# Patient Record
Sex: Male | Born: 1985 | Race: Black or African American | Hispanic: No | Marital: Single | Smoking: Current every day smoker
Health system: Southern US, Community
[De-identification: ages and names within clinical notes are randomized; demographics above are authoritative.]

## PROBLEM LIST (undated history)

## (undated) DIAGNOSIS — R569 Unspecified convulsions: Secondary | ICD-10-CM

---

## 2017-01-24 ENCOUNTER — Emergency Department (HOSPITAL_COMMUNITY)
Admission: EM | Admit: 2017-01-24 | Discharge: 2017-01-24 | Disposition: A | Discharge status: Home / Self Care | Payer: Medicare Other | Attending: Emergency Medicine | Admitting: Emergency Medicine

## 2017-01-24 ENCOUNTER — Other Ambulatory Visit: Payer: Self-pay

## 2017-01-24 ENCOUNTER — Encounter (HOSPITAL_COMMUNITY): Payer: Self-pay

## 2017-01-24 ENCOUNTER — Emergency Department (HOSPITAL_COMMUNITY): Payer: Medicare Other

## 2017-01-24 DIAGNOSIS — R197 Diarrhea, unspecified: Secondary | ICD-10-CM | POA: Insufficient documentation

## 2017-01-24 DIAGNOSIS — B349 Viral infection, unspecified: Secondary | ICD-10-CM | POA: Diagnosis not present

## 2017-01-24 DIAGNOSIS — F17210 Nicotine dependence, cigarettes, uncomplicated: Secondary | ICD-10-CM | POA: Diagnosis not present

## 2017-01-24 DIAGNOSIS — R0981 Nasal congestion: Secondary | ICD-10-CM | POA: Diagnosis not present

## 2017-01-24 DIAGNOSIS — R070 Pain in throat: Secondary | ICD-10-CM | POA: Insufficient documentation

## 2017-01-24 DIAGNOSIS — R112 Nausea with vomiting, unspecified: Secondary | ICD-10-CM | POA: Diagnosis present

## 2017-01-24 DIAGNOSIS — R05 Cough: Secondary | ICD-10-CM | POA: Insufficient documentation

## 2017-01-24 HISTORY — DX: Unspecified convulsions: R56.9

## 2017-01-24 LAB — COMPREHENSIVE METABOLIC PANEL
ALT: 33 U/L (ref 17–63)
AST: 31 U/L (ref 15–41)
Albumin: 4.3 g/dL (ref 3.5–5.0)
Alkaline Phosphatase: 70 U/L (ref 38–126)
Anion gap: 8 (ref 5–15)
BILIRUBIN TOTAL: 0.8 mg/dL (ref 0.3–1.2)
BUN: 10 mg/dL (ref 6–20)
CHLORIDE: 99 mmol/L — AB (ref 101–111)
CO2: 28 mmol/L (ref 22–32)
Calcium: 9.5 mg/dL (ref 8.9–10.3)
Creatinine, Ser: 1.14 mg/dL (ref 0.61–1.24)
Glucose, Bld: 90 mg/dL (ref 65–99)
Potassium: 3.8 mmol/L (ref 3.5–5.1)
SODIUM: 135 mmol/L (ref 135–145)
TOTAL PROTEIN: 8.1 g/dL (ref 6.5–8.1)

## 2017-01-24 LAB — URINALYSIS, ROUTINE W REFLEX MICROSCOPIC
BILIRUBIN URINE: NEGATIVE
GLUCOSE, UA: NEGATIVE mg/dL
HGB URINE DIPSTICK: NEGATIVE
Ketones, ur: NEGATIVE mg/dL
NITRITE: NEGATIVE
PH: 5 (ref 5.0–8.0)
Protein, ur: 30 mg/dL — AB
SPECIFIC GRAVITY, URINE: 1.035 — AB (ref 1.005–1.030)

## 2017-01-24 LAB — CBC
HCT: 45.5 % (ref 39.0–52.0)
Hemoglobin: 15.1 g/dL (ref 13.0–17.0)
MCH: 28.5 pg (ref 26.0–34.0)
MCHC: 33.2 g/dL (ref 30.0–36.0)
MCV: 85.8 fL (ref 78.0–100.0)
PLATELETS: 195 10*3/uL (ref 150–400)
RBC: 5.3 MIL/uL (ref 4.22–5.81)
RDW: 14.1 % (ref 11.5–15.5)
WBC: 8.5 10*3/uL (ref 4.0–10.5)

## 2017-01-24 LAB — LIPASE, BLOOD: Lipase: 31 U/L (ref 11–51)

## 2017-01-24 LAB — RAPID STREP SCREEN (MED CTR MEBANE ONLY): Streptococcus, Group A Screen (Direct): NEGATIVE

## 2017-01-24 MED ORDER — ONDANSETRON HCL 4 MG PO TABS: 4.0000 mg | ORAL_TABLET | Freq: Three times a day (TID) | ORAL | 0 refills | Status: AC | PRN

## 2017-01-24 MED ORDER — ONDANSETRON 4 MG PO TBDP
4.0000 mg | ORAL_TABLET | Freq: Once | ORAL | Status: AC
  Administered 2017-01-24: 4 mg via ORAL
  Filled 2017-01-24: qty 1

## 2017-01-24 MED ORDER — BENZONATATE 100 MG PO CAPS: 100.0000 mg | ORAL_CAPSULE | Freq: Three times a day (TID) | ORAL | 0 refills | Status: AC

## 2017-01-24 MED ORDER — FLUTICASONE PROPIONATE 50 MCG/ACT NA SUSP: 1.0000 | Freq: Every day | NASAL | 0 refills | Status: AC

## 2017-01-24 NOTE — ED Provider Notes (Signed)
[facility]PART[facility] Provider Note   CSN: [id number] Arrival date & time: 01/24/17  1414     History   Chief Complaint Chief Complaint  Patient presents with  . Emesis    HPI Noah DownsMaurice Gosnell is a 31 y.o. male presenting with 1 week history of nausea, vomiting, diarrhea, sore throat, cough, and nasal congestion.  Patient states he has been having symptoms for the past week.  He has tried 1 dose of NyQuil without improvement.  He states that every time he tries to eat or drink anything, he has postprandial vomiting.  His stool is nonbloody.  He reports some intermittent generalized abdominal cramping after vomiting and BMs.  He is unsure if he has had any fevers, but states that he feels he is occasionally more sweaty than normal.  Prior to a week ago, he was symptom-free.  He denies sick contacts.  States that he got his flu shot a month ago at his primary care doctor.  He denies eye pain, ear pain, chest pain, shortness of breath, or urinary symptoms.  Patient has a history of seizures for which he takes Keppra, no other medical history.  No other medicines daily.   HPI  Past Medical History:  Diagnosis Date  . Seizures (HCC)     There are no active problems to display for this patient.   History reviewed. No pertinent surgical history.     Home Medications    Prior to Admission medications   Medication Sig Start Date End Date Taking? Authorizing Provider  benzonatate (TESSALON) 100 MG capsule Take 1 capsule (100 mg total) every 8 (eight) hours by mouth. 01/24/17   Makesha Belitz, PA-C  fluticasone (FLONASE) 50 MCG/ACT nasal spray Place 1 spray daily into both nostrils. 01/24/17   Mauri Tolen, PA-C  ondansetron (ZOFRAN) 4 MG tablet Take 1 tablet (4 mg total) every 8 (eight) hours as needed by mouth for nausea or vomiting. 01/24/17   Irelynn Schermerhorn, PA-C    Family History No family history on file.  Social History Social History   Tobacco Use    . Smoking status: Current Every Day Smoker    Packs/day: 2.00    Types: Cigarettes  . Smokeless tobacco: Never Used  Substance Use Topics  . Alcohol use: Yes    Comment: occasional  . Drug use: No     Allergies   Patient has no known allergies.   Review of Systems Review of Systems  Constitutional: Positive for appetite change and fever (Subjective). Negative for chills.  HENT: Positive for congestion and sore throat. Negative for trouble swallowing and voice change.   Eyes: Negative for pain.  Respiratory: Positive for cough. Negative for chest tightness and shortness of breath.   Cardiovascular: Negative for chest pain, palpitations and leg swelling.  Gastrointestinal: Positive for diarrhea, nausea and vomiting. Negative for blood in stool.  Genitourinary: Negative for dysuria, frequency and hematuria.  Musculoskeletal: Negative for back pain.  Skin: Negative for rash.  Allergic/Immunologic: Negative for immunocompromised state.  Neurological: Negative for headaches.  Hematological: Does not bruise/bleed easily.     Physical Exam Updated Vital Signs BP 128/83 (BP Location: Right Arm)   Pulse 92   Temp 99.6 F (37.6 C) (Oral)   Resp 16   Ht 5\' 11"  (1.803 m)   Wt 108 kg (238 lb)   SpO2 99%   BMI 33.19 kg/m   Physical Exam  Constitutional: He is oriented to person, place, and time. He appears well-developed  and well-nourished. No distress.  HENT:  Head: Normocephalic and atraumatic.  Right Ear: Tympanic membrane, external ear and ear canal normal.  Left Ear: Tympanic membrane, external ear and ear canal normal.  Nose: Mucosal edema present. Right sinus exhibits frontal sinus tenderness. Right sinus exhibits no maxillary sinus tenderness. Left sinus exhibits frontal sinus tenderness. Left sinus exhibits no maxillary sinus tenderness.  Mouth/Throat: Uvula is midline, oropharynx is clear and moist and mucous membranes are normal. No tonsillar exudate.  Nasal mucosal  edema.  Tenderness to palpation of frontal sinuses.  No tonsillar swelling or exudate.  Eyes: Conjunctivae and EOM are normal. Pupils are equal, round, and reactive to light.  Neck: Normal range of motion.  Cardiovascular: Normal rate, regular rhythm and intact distal pulses.  Pulmonary/Chest: Effort normal and breath sounds normal. He has no decreased breath sounds. He has no wheezes. He has no rhonchi. He has no rales.  Pt speaking in full sentences without difficulty.  Clear lung sounds in all fields.  Abdominal: Soft. Bowel sounds are normal. He exhibits no distension and no mass. There is no rebound and no guarding.  Normal active bowel sounds.  No rigidity or guarding.  Mild tenderness to palpation of generalized abdomen which is improved with distraction.  Musculoskeletal: Normal range of motion.  Lymphadenopathy:    He has no cervical adenopathy.  Neurological: He is alert and oriented to person, place, and time.  Skin: Skin is warm.  Psychiatric: He has a normal mood and affect.  Nursing note and vitals reviewed.    ED Treatments / Results  Labs (all labs ordered are listed, but only abnormal results are displayed) Labs Reviewed  COMPREHENSIVE METABOLIC PANEL - Abnormal; Notable for the following components:      Result Value   Chloride 99 (*)    All other components within normal limits  URINALYSIS, ROUTINE W REFLEX MICROSCOPIC - Abnormal; Notable for the following components:   APPearance HAZY (*)    Specific Gravity, Urine 1.035 (*)    Protein, ur 30 (*)    Leukocytes, UA TRACE (*)    Bacteria, UA RARE (*)    Squamous Epithelial / LPF 0-5 (*)    All other components within normal limits  RAPID STREP SCREEN (NOT AT [facility])  CULTURE, GROUP A STREP ([facility])  LIPASE, BLOOD  CBC    EKG  EKG Interpretation None       Radiology Dg Chest 2 View  Result Date: 01/24/2017 CLINICAL DATA:  31 year old male with cough, fever, and sore throat. EXAM: CHEST  2 VIEW  COMPARISON:  None. FINDINGS: The heart size and mediastinal contours are within normal limits. Both lungs are clear. The visualized skeletal structures are unremarkable. IMPRESSION: No active cardiopulmonary disease. Electronically Signed   By: Elgie CollardArash  Radparvar M.D.   On: 01/24/2017 18:26    Procedures Procedures (including critical care time)  Medications Ordered in ED Medications  ondansetron (ZOFRAN-ODT) disintegrating tablet 4 mg (4 mg Oral Given 01/24/17 1741)     Initial Impression / Assessment and Plan / ED Course  I have reviewed the triage vital signs and the nursing notes.  Pertinent labs & imaging results that were available during my care of the patient were reviewed by me and considered in my medical decision making (see chart for details).     Patient presenting for 1 week history of viral symptoms.  Physical exam shows patient is afebrile not tachycardic.  Patient with nasal mucosal edema and frontal pressure.  Abdominal  exam reassuring, as patient with mild tenderness, which improves with distraction.  Labs reassuring, patient without electively abnormalities or leukocytosis, normal lipase, and no change in kidney function.  Likely viral illness.  Will obtain rapid strep and chest x-ray to ensure no bacterial infection.  UA negative for UTI.  Zofran given for nausea.  X-ray and rapid strep negative.  Patient reports improved nausea with Zofran.  Patient tolerated p.o. without difficulty.  Discussed findings with patient.  Discussed symptomatic treatment of viral illnesses.  Patient to follow-up with primary care if symptoms are not improving.  At this time, patient appears safe for discharge.  Return precautions given.  Patient states he understands and agrees to plan.   Final Clinical Impressions(s) / ED Diagnoses   Final diagnoses:  Viral illness    ED Discharge Orders        Ordered    ondansetron (ZOFRAN) 4 MG tablet  Every 8 hours PRN     01/24/17 1902     fluticasone (FLONASE) 50 MCG/ACT nasal spray  Daily     01/24/17 1902    benzonatate (TESSALON) 100 MG capsule  Every 8 hours     01/24/17 1902       Alveria Apley, PA-C 01/25/17 0159    Samuel Jester, DO 01/26/17 1013

## 2017-01-24 NOTE — ED Triage Notes (Signed)
Patient reports of vomiting x1 week. Recently had flu shot last night. Also reports of cough.

## 2017-01-24 NOTE — Discharge Instructions (Signed)
You likely have a viral illness.  This should be treated symptomatically. Use Zofran as needed for nausea or vomiting.  Make sure to eat a bland diet over the next several days.  Avoid spicy, acidic, or fatty foods.  Avoid sugary drinks, as this will cause worsening diarrhea. Use Flonase for nasal congestion and cough. Use Tessalon Perles for cough and sore throat.  Use Tylenol or ibuprofen as needed for fever or body aches. It is very important that she stay well-hydrated with water. Make sure to wash your hands frequently to prevent spread of infection. Follow-up with your primary care doctor by Thursday or Friday for symptoms are not improving. Return to the emergency room if you develop persistent vomiting despite medication, difficulty breathing, or any new or worsening symptoms.

## 2017-01-24 NOTE — ED Notes (Signed)
Pt given gingerale for fluid challenge. 

## 2017-01-24 NOTE — ED Notes (Signed)
Pt alert & oriented x4, stable gait. Patient given discharge instructions, paperwork & prescription(s). Patient  instructed to stop at the registration desk to finish any additional paperwork. Patient verbalized understanding. Pt left department w/ no further questions. 

## 2017-01-27 LAB — CULTURE, GROUP A STREP (THRC)

## 2018-05-02 IMAGING — DX DG CHEST 2V
2 series · 2 of 2 positions shown · non-contrast
Comparison: None.

CLINICAL DATA: 31-year-old male with cough, fever, and sore throat.

EXAM:
CHEST  2 VIEW

[chest pa]
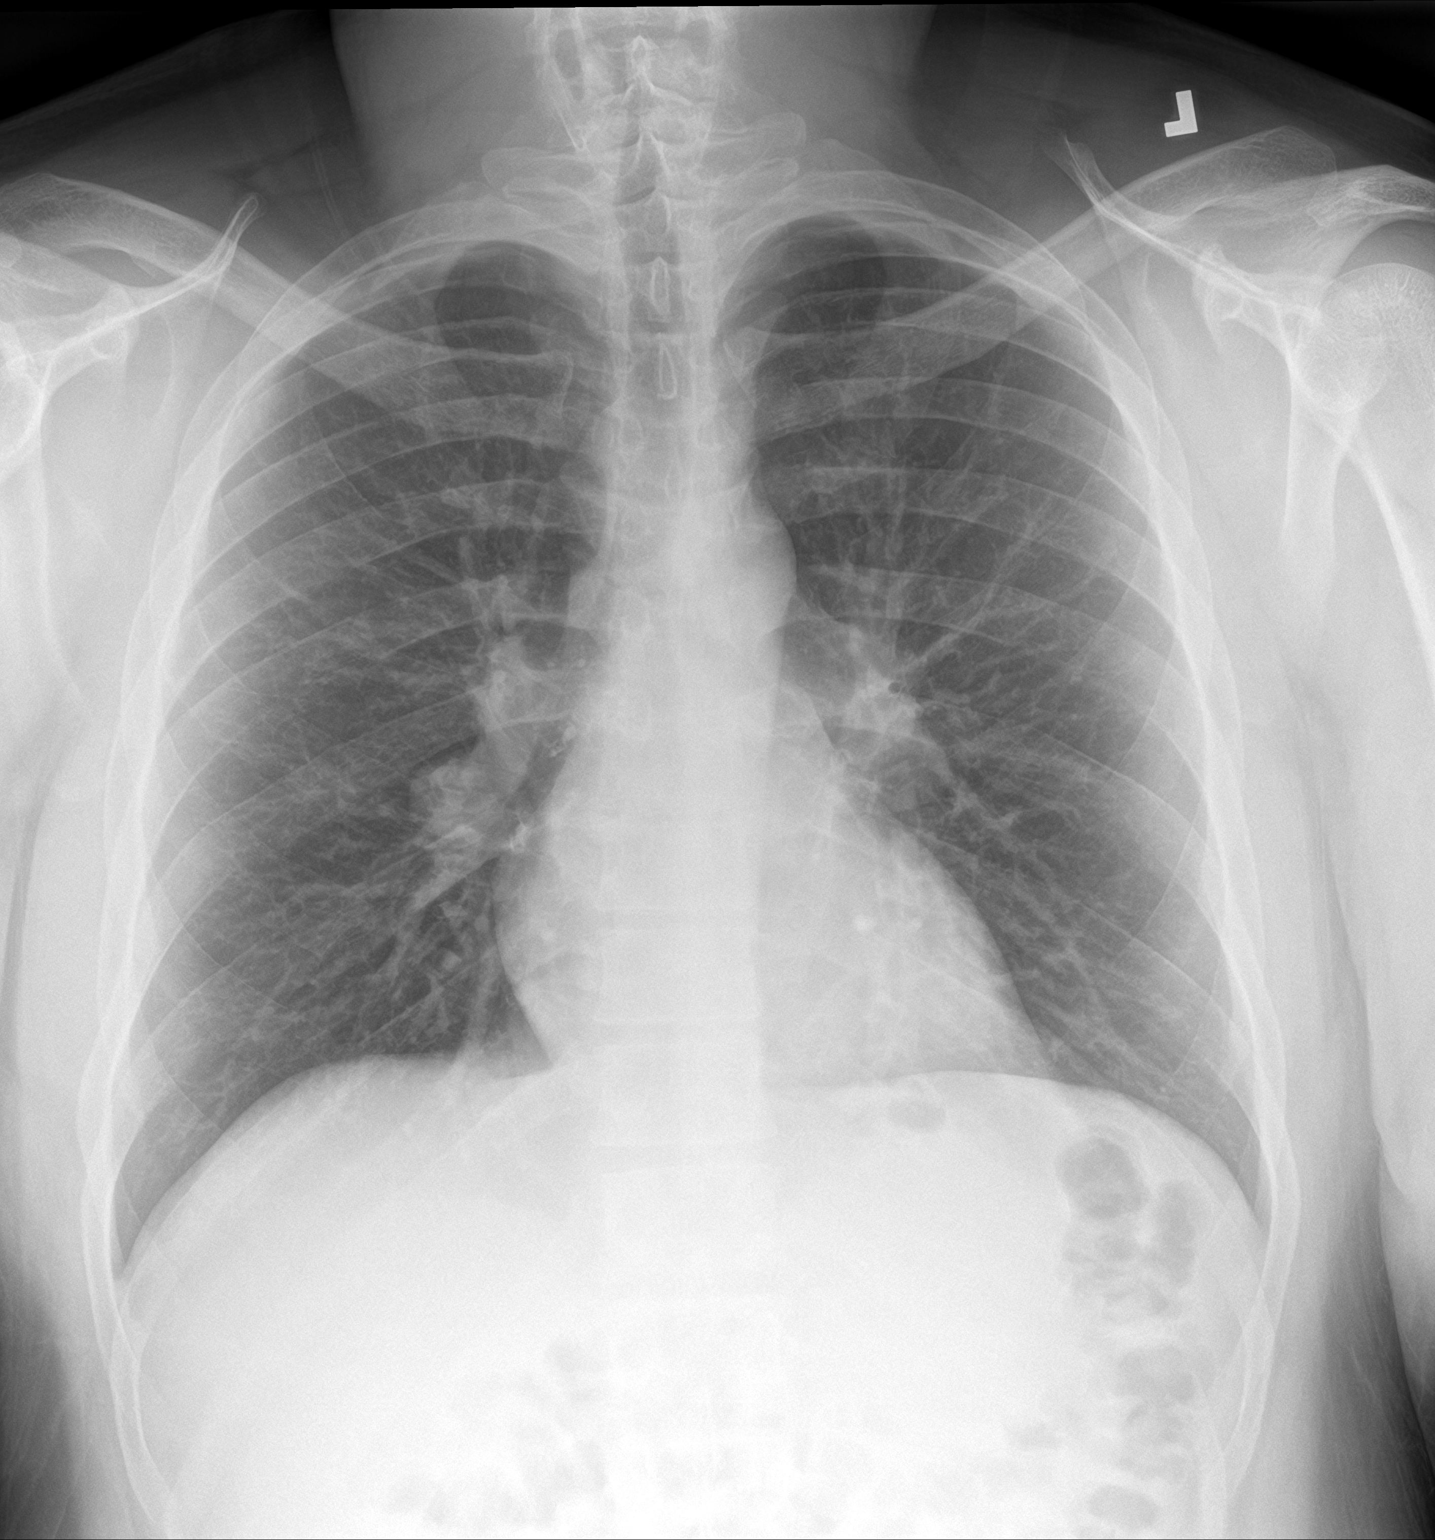

[chest lat]
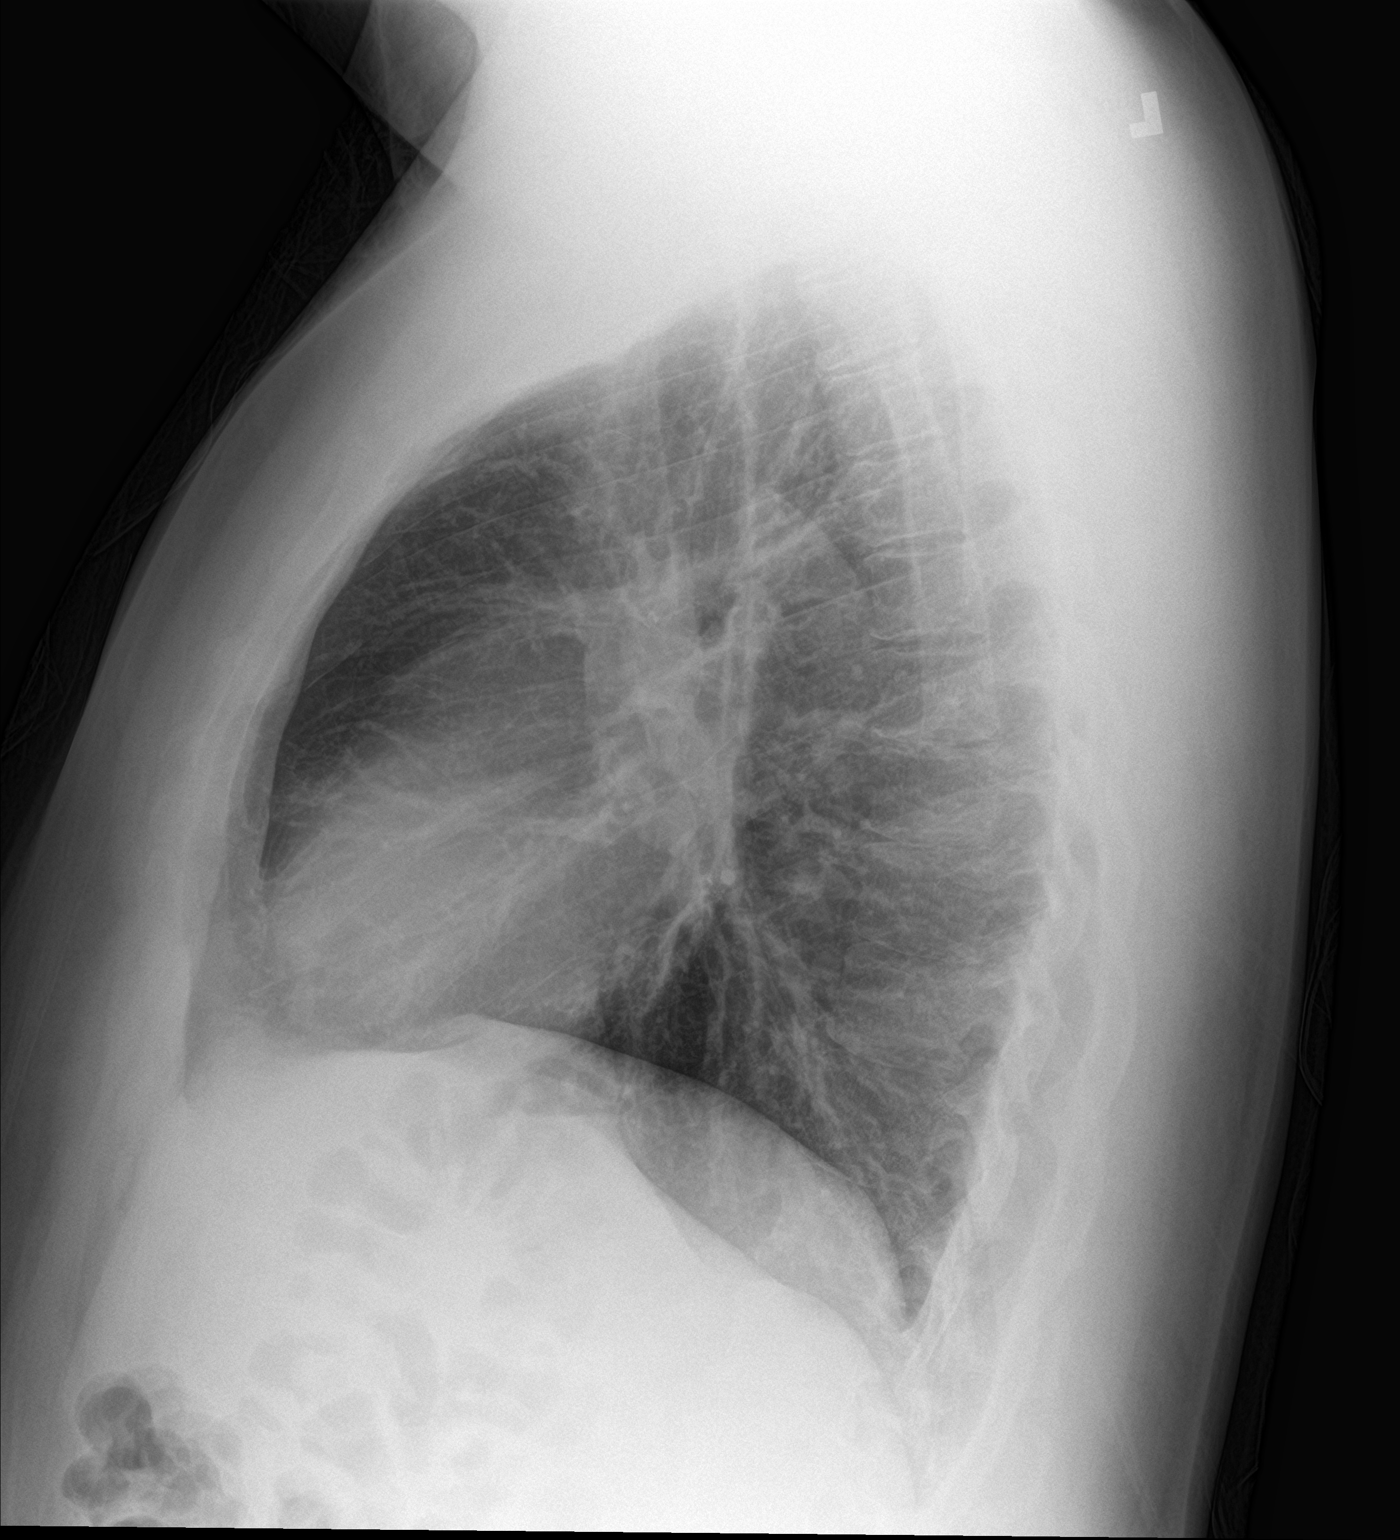

[2 of 2 positions shown; findings below may reference images not displayed]

FINDINGS: The heart size and mediastinal contours are within normal limits.
Both lungs are clear. The visualized skeletal structures are
unremarkable.
IMPRESSION: No active cardiopulmonary disease.
